# Patient Record
Sex: Male | Born: 1985 | Race: Black or African American | Hispanic: No | Marital: Single | Smoking: Former smoker
Health system: Southern US, Community
[De-identification: ages and names within clinical notes are randomized; demographics above are authoritative.]

---

## 2011-05-22 ENCOUNTER — Ambulatory Visit (INDEPENDENT_AMBULATORY_CARE_PROVIDER_SITE_OTHER): Payer: Managed Care, Other (non HMO) | Admitting: Physician Assistant

## 2011-05-22 DIAGNOSIS — Z209 Contact with and (suspected) exposure to unspecified communicable disease: Secondary | ICD-10-CM

## 2011-05-22 DIAGNOSIS — B86 Scabies: Secondary | ICD-10-CM

## 2011-05-22 DIAGNOSIS — N489 Disorder of penis, unspecified: Secondary | ICD-10-CM

## 2011-05-30 ENCOUNTER — Telehealth: Payer: Self-pay

## 2011-05-30 NOTE — Telephone Encounter (Signed)
Amy from [facility] calling to talk with a nurse regarding pts diagnosis of chlymidia  Phone is [phone]

## 2011-05-30 NOTE — Telephone Encounter (Signed)
Eugene Bailey stated that she got everything she needed

## 2011-05-30 NOTE — Telephone Encounter (Signed)
Spoke with Kathie Rhodes at [employer] because number for Amy in the message was given was not a valid number.  Kathie Rhodes will be calling Amy to give [place] a call back.

## 2011-06-13 ENCOUNTER — Ambulatory Visit (INDEPENDENT_AMBULATORY_CARE_PROVIDER_SITE_OTHER): Payer: Managed Care, Other (non HMO) | Admitting: Physician Assistant

## 2011-06-13 VITALS — BP 119/73 | HR 60 | Temp 97.7°F | Resp 16 | Ht 73.0 in | Wt 177.0 lb

## 2011-06-13 DIAGNOSIS — Z8619 Personal history of other infectious and parasitic diseases: Secondary | ICD-10-CM

## 2011-06-13 NOTE — Progress Notes (Signed)
  Subjective:    Patient ID: Eusebio Blazejewski, male    DOB: 1985-06-14, 26 y.o.   MRN: [id number]  HPI  Presents to clinic for recheck STD - tested Chlamydia positive on 1/23 and took Zithromax - had no symptoms then and has no symptoms now.  ! Current male partner has also been treated.  Review of Systems     Objective:   Physical Exam  Constitutional: He is oriented to person, place, and time. He appears well-developed and well-nourished.  HENT:  Head: Normocephalic and atraumatic.  Right Ear: External ear normal.  Left Ear: External ear normal.  Pulmonary/Chest: Effort normal.  Neurological: He is alert and oriented to person, place, and time.  Skin: Skin is warm and dry.  Psychiatric: He has a normal mood and affect. His behavior is normal. Judgment and thought content normal.          Assessment & Plan:   1. H/O chlamydia infection  GC/chlamydia probe amp, urine   Will call pt with results - if needs abx will Rx at that time.

## 2011-06-14 LAB — GC/CHLAMYDIA PROBE AMP, URINE
Chlamydia, Swab/Urine, PCR: NEGATIVE
GC Probe Amp, Urine: NEGATIVE

## 2013-11-26 ENCOUNTER — Emergency Department: Payer: Self-pay | Admitting: Emergency Medicine

## 2015-01-22 ENCOUNTER — Emergency Department (HOSPITAL_COMMUNITY): Payer: No Typology Code available for payment source

## 2015-01-22 ENCOUNTER — Emergency Department (HOSPITAL_COMMUNITY)
Admission: EM | Admit: 2015-01-22 | Discharge: 2015-01-22 | Disposition: A | Discharge status: Home / Self Care | Payer: No Typology Code available for payment source | Attending: Emergency Medicine | Admitting: Emergency Medicine

## 2015-01-22 ENCOUNTER — Encounter (HOSPITAL_COMMUNITY): Payer: Self-pay

## 2015-01-22 DIAGNOSIS — S3992XA Unspecified injury of lower back, initial encounter: Secondary | ICD-10-CM | POA: Diagnosis not present

## 2015-01-22 DIAGNOSIS — F10920 Alcohol use, unspecified with intoxication, uncomplicated: Secondary | ICD-10-CM

## 2015-01-22 DIAGNOSIS — Y92410 Unspecified street and highway as the place of occurrence of the external cause: Secondary | ICD-10-CM | POA: Insufficient documentation

## 2015-01-22 DIAGNOSIS — F10120 Alcohol abuse with intoxication, uncomplicated: Secondary | ICD-10-CM | POA: Insufficient documentation

## 2015-01-22 DIAGNOSIS — M25512 Pain in left shoulder: Secondary | ICD-10-CM

## 2015-01-22 DIAGNOSIS — S4992XA Unspecified injury of left shoulder and upper arm, initial encounter: Secondary | ICD-10-CM | POA: Diagnosis present

## 2015-01-22 DIAGNOSIS — N289 Disorder of kidney and ureter, unspecified: Secondary | ICD-10-CM | POA: Insufficient documentation

## 2015-01-22 DIAGNOSIS — Z87891 Personal history of nicotine dependence: Secondary | ICD-10-CM | POA: Diagnosis not present

## 2015-01-22 DIAGNOSIS — Y998 Other external cause status: Secondary | ICD-10-CM | POA: Insufficient documentation

## 2015-01-22 DIAGNOSIS — Y9389 Activity, other specified: Secondary | ICD-10-CM | POA: Diagnosis not present

## 2015-01-22 DIAGNOSIS — R000 Tachycardia, unspecified: Secondary | ICD-10-CM | POA: Insufficient documentation

## 2015-01-22 LAB — I-STAT CHEM 8, ED
BUN: 11 mg/dL (ref 6–20)
CALCIUM ION: 1.09 mmol/L — AB (ref 1.12–1.23)
CHLORIDE: 104 mmol/L (ref 101–111)
Creatinine, Ser: 1.7 mg/dL — ABNORMAL HIGH (ref 0.61–1.24)
Glucose, Bld: 109 mg/dL — ABNORMAL HIGH (ref 65–99)
HCT: 46 % (ref 39.0–52.0)
Hemoglobin: 15.6 g/dL (ref 13.0–17.0)
Potassium: 4.2 mmol/L (ref 3.5–5.1)
SODIUM: 143 mmol/L (ref 135–145)
TCO2: 23 mmol/L (ref 0–100)

## 2015-01-22 MED ORDER — IOHEXOL 300 MG/ML  SOLN
100.0000 mL | Freq: Once | INTRAMUSCULAR | Status: AC | PRN
  Administered 2015-01-22: 100 mL via INTRAVENOUS

## 2015-01-22 MED ORDER — METHOCARBAMOL 500 MG PO TABS: 500.0000 mg | ORAL_TABLET | Freq: Four times a day (QID) | ORAL | Status: DC | PRN

## 2015-01-22 MED ORDER — SODIUM CHLORIDE 0.9 % IV BOLUS (SEPSIS)
1000.0000 mL | Freq: Once | INTRAVENOUS | Status: AC
Start: 2015-01-22 — End: 2015-01-22
  Administered 2015-01-22: 1000 mL via INTRAVENOUS

## 2015-01-22 NOTE — ED Notes (Signed)
PA at bedside.
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
## 2019-12-29 ENCOUNTER — Ambulatory Visit: Payer: Self-pay

## 2019-12-29 NOTE — Telephone Encounter (Signed)
Patient called and says since Monday he's been having moderate to severe abdominal pain to the center of his chest over to the right. He says it started on the right side, then moved to the upper abdomen in the center of the chest. He says the pain is so bad at times it's hard to take a deep breath. He says he hasn't eaten anything in 4 days, he only drinks and sometimes that will come back up. He says eating makes it worse, he doesn't have an appetite. I advised him to go to the ED for evaluation, he verbalized understanding.  Reason for Disposition . [1] SEVERE pain (e.g., excruciating) AND [2] present > 1 hour  Answer Assessment - Initial Assessment Questions 1. LOCATION: "Where does it hurt?"      Upper mid abdomen, sharp to the middle of my stomach 2. RADIATION: "Does the pain shoot anywhere else?" (e.g., chest, back)     Moves to the right 3. ONSET: "When did the pain begin?" (e.g., minutes, hours or days ago)      Noticed it on Monday 4. SUDDEN: "Gradual or sudden onset?"     Sudden started on the right side 5. PATTERN "Does the pain come and go, or is it constant?"    - If constant: "Is it getting better, staying the same, or worsening?"      (Note: Constant means the pain never goes away completely; most serious pain is constant and it progresses)     - If intermittent: "How long does it last?" "Do you have pain now?"     (Note: Intermittent means the pain goes away completely between bouts)     Constant 6. SEVERITY: "How bad is the pain?"  (e.g., Scale 1-10; mild, moderate, or severe)    - MILD (1-3): doesn't interfere with normal activities, abdomen soft and not tender to touch     - MODERATE (4-7): interferes with normal activities or awakens from sleep, tender to touch     - SEVERE (8-10): excruciating pain, doubled over, unable to do any normal activities       Moderate 5-7 7. RECURRENT SYMPTOM: "Have you ever had this type of stomach pain before?" If Yes, ask: "When was the  last time?" and "What happened that time?"      No 8. AGGRAVATING FACTORS: "Does anything seem to cause this pain?" (e.g., foods, stress, alcohol)     Eating 9. CARDIAC SYMPTOMS: "Do you have any of the following symptoms: chest pain, difficulty breathing, sweating, nausea?"     Nausea, hard to breathe because of the pain 10. OTHER SYMPTOMS: "Do you have any other symptoms?" (e.g., fever, vomiting, diarrhea)       Vomiting, loss of appetite 11. PREGNANCY: "Is there any chance you are pregnant?" "When was your last menstrual period?"       N/A  Protocols used: ABDOMINAL PAIN - UPPER-A-AH

## 2023-10-20 ENCOUNTER — Ambulatory Visit (HOSPITAL_COMMUNITY)
Admission: EM | Admit: 2023-10-20 | Discharge: 2023-10-20 | Disposition: A | Discharge status: Home / Self Care | Attending: Nurse Practitioner | Admitting: Nurse Practitioner

## 2023-10-20 ENCOUNTER — Encounter (HOSPITAL_COMMUNITY): Payer: Self-pay

## 2023-10-20 ENCOUNTER — Ambulatory Visit (INDEPENDENT_AMBULATORY_CARE_PROVIDER_SITE_OTHER)

## 2023-10-20 DIAGNOSIS — M25561 Pain in right knee: Secondary | ICD-10-CM

## 2023-10-20 MED ORDER — NAPROXEN 500 MG PO TABS: 500.0000 mg | ORAL_TABLET | Freq: Two times a day (BID) | ORAL | 0 refills | Status: DC

## 2023-10-20 NOTE — ED Triage Notes (Signed)
 Patient here today with c/o right knee pain after squatting today. Patient started in the thigh and moved to knee.

## 2023-10-20 NOTE — ED Provider Notes (Signed)
 [facility]    CSN: [id number] Arrival date & time: 10/20/23  1432      History   Chief Complaint Chief Complaint  Patient presents with   Knee Pain    HPI Eugene Bailey is a 38 y.o. male.   Discussed the use of AI scribe software for clinical note transcription with the patient, who gave verbal consent to proceed.   Eugene Bailey Reason is a 38 y.o. male that presents with acute knee pain that began this morning. The patient reports waking up feeling fine, but upon squatting outside, he experienced a sharp pain in his thigh that then moved to his knee.  The pain is described as constant around the sides of the knee and shoots back up to the thigh when the leg is extended. He reports tingling and numbness up into the thigh as well that occurs intermittently. The patient denies any swelling. Patient mentions having had knee issues before, but states this episode is more persistent than previous occurrences. He has attempted to manage the pain with ice and a lidocaine patch, which has not provided any significant relief. The patient denies taking any oral medications for the pain.  The following portions of the patient's history were reviewed and updated as appropriate: allergies, current medications, past family history, past medical history, past social history, past surgical history, and problem list.    History reviewed. No pertinent past medical history.  There are no active problems to display for this patient.   History reviewed. No pertinent surgical history.     Home Medications    Prior to Admission medications   Medication Sig Start Date End Date Taking? Authorizing Provider  naproxen (NAPROSYN) 500 MG tablet Take 1 tablet (500 mg total) by mouth 2 (two) times daily with a meal. 10/20/23  Yes Iola Lukes, FNP    Family History History reviewed. No pertinent family history.  Social History Social History   Tobacco Use   Smoking status:  Former  Substance Use Topics   Alcohol use: Yes     Allergies   Tomato   Review of Systems Review of Systems  Musculoskeletal:  Positive for gait problem.       Right knee pain  Neurological:  Negative for weakness and numbness.  All other systems reviewed and are negative.    Physical Exam Triage Vital Signs ED Triage Vitals  Encounter Vitals Group     BP 10/20/23 1531 126/78     Girls Systolic BP Percentile --      Girls Diastolic BP Percentile --      Boys Systolic BP Percentile --      Boys Diastolic BP Percentile --      Pulse Rate 10/20/23 1531 70     Resp 10/20/23 1531 16     Temp 10/20/23 1531 98.6 F (37 C)     Temp Source 10/20/23 1531 Oral     SpO2 10/20/23 1531 95 %     Weight --      Height --      Head Circumference --      Peak Flow --      Pain Score 10/20/23 1532 8     Pain Loc --      Pain Education --      Exclude from Growth Chart --    No data found.  Updated Vital Signs BP 126/78 (BP Location: Right Arm)   Pulse 70   Temp 98.6 F (37 C) (Oral)  Resp 16   SpO2 95%   Visual Acuity Right Eye Distance:   Left Eye Distance:   Bilateral Distance:    Right Eye Near:   Left Eye Near:    Bilateral Near:     Physical Exam Vitals reviewed.  Constitutional:      General: He is not in acute distress.    Appearance: Normal appearance. He is not toxic-appearing.  HENT:     Head: Normocephalic.     Mouth/Throat:     Mouth: Mucous membranes are moist.   Eyes:     Conjunctiva/sclera: Conjunctivae normal.    Cardiovascular:     Rate and Rhythm: Normal rate and regular rhythm.     Heart sounds: Normal heart sounds.  Pulmonary:     Effort: Pulmonary effort is normal.     Breath sounds: Normal breath sounds.   Musculoskeletal:        General: Normal range of motion.     Right knee: No swelling, deformity, effusion, erythema, ecchymosis or crepitus. Normal range of motion. Tenderness present over the medial joint line and lateral  joint line. Normal alignment, normal meniscus and normal patellar mobility.       Legs:   Skin:    General: Skin is warm and dry.   Neurological:     General: No focal deficit present.     Mental Status: He is alert and oriented to person, place, and time.     Sensory: Sensation is intact.     Motor: Motor function is intact.     Coordination: Coordination is intact.     Gait: Gait is intact.      UC Treatments / Results  Labs (all labs ordered are listed, but only abnormal results are displayed) Labs Reviewed - No data to display  EKG   Radiology DG Knee Complete 4 Views Right Result Date: 10/20/2023 CLINICAL DATA:  Acute pain without injury EXAM: RIGHT KNEE - COMPLETE 4 VIEW COMPARISON:  None Available. FINDINGS: No evidence of fracture, dislocation, or joint effusion. No evidence of arthropathy or other focal bone abnormality. Soft tissues are unremarkable. IMPRESSION: No acute osseous abnormality. Electronically Signed   By: Ranell Bring M.D.   On: 10/20/2023 16:19    Procedures Procedures (including critical care time)  Medications Ordered in UC Medications - No data to display  Initial Impression / Assessment and Plan / UC Course  I have reviewed the triage vital signs and the nursing notes.  Pertinent labs & imaging results that were available during my care of the patient were reviewed by me and considered in my medical decision making (see chart for details).    The patient presents with acute onset knee pain that began this morning, initially starting in the thigh and radiating to the knee following a squatting motion. Pain is described as constant along the sides of the knee and radiates back up the thigh with leg extension. There is no visible swelling, but the patient reports intermittent numbness and tingling in the anterior thigh. Lidocaine patches and ice have provided minimal relief. Given the sudden onset, lack of swelling, and distribution of symptoms,  the likely diagnosis is tendinitis or inflammatory soft tissue injury. A knee X-ray was ordered to rule out bony abnormalities or joint effusion; results will be reviewed by radiology and the patient will be contacted only if findings are concerning. The patient was prescribed Naprosyn to be taken twice daily with food for 10 days and advised to avoid other  NSAIDs while on this medication. Supportive treatment includes continued use of lidocaine patches, intermittent ice application for 20 minutes at a time, and use of a knee sleeve for compression. The patient was educated on the PRICE method (Protect, Rest, Ice, Compression, Elevation) and may discontinue the sleeve as symptoms improve. Tylenol may be used for additional pain relief if needed. Follow-up with orthopedics is recommended if symptoms do not improve within 10 days. ED evaluation is advised for worsening pain, swelling, or new neurological symptoms.  Today's evaluation has revealed no signs of a dangerous process. Discussed diagnosis with patient and/or guardian. Patient and/or guardian aware of their diagnosis, possible red flag symptoms to watch out for and need for close follow up. Patient and/or guardian understands verbal and written discharge instructions. Patient and/or guardian comfortable with plan and disposition.  Patient and/or guardian has a clear mental status at this time, good insight into illness (after discussion and teaching) and has clear judgment to make decisions regarding their care  Documentation was completed with the aid of voice recognition software. Transcription may contain typographical errors. Final Clinical Impressions(s) / UC Diagnoses   Final diagnoses:  Acute pain of right knee     Discharge Instructions      You were seen today for sudden knee pain that began after squatting. The pain started in your thigh and moved to your knee, with discomfort radiating back up the thigh during leg extension. There is  no swelling, but you've noticed some numbness and tingling in the front of your thigh. These symptoms are likely due to inflammation or tendon strain. A knee X-ray was done today, and results will be reviewed by a radiologist. You will be contacted if anything concerning is found, or you can check your MyChart for updates. You have been prescribed Naprosyn to take twice daily with food for 10 days to reduce pain and inflammation. Avoid aspirin or other NSAIDs while taking this medication. If needed, you may take Tylenol (acetaminophen) 1000 mg every six hours for additional pain relief. This equals two 500 mg tablets at a time. Be careful not to take more than 4000 mg of Tylenol in a 24-hour period. You may continue to use lidocaine patches. Apply ice for about 20 minutes at a time, several times throughout the day. Always place a towel or cloth between the ice and your skin to protect it. If your skin becomes very red or you lose the sensation of cold, remove the ice immediately to avoid injury. A knee sleeve has been provided to support your knee and help control discomfort. If your toes start to tingle, feel numb, or turn cold and blue, loosen the sleeve or brace right away. You can remove it when sleeping or showering. You may stop using the knee sleeve as your symptoms improve. Whenever possible, keep your leg elevated by propping it up on a pillow, especially under the knee, to reduce any swelling.  If your symptoms do not improve within the next 10 to 14 days, please follow up with an orthopedic specialist for further evaluation.  Seek emergency care if you develop significant swelling, worsening pain, sudden weakness in the leg, or loss of sensation.         ED Prescriptions     Medication Sig Dispense Auth. Provider   naproxen (NAPROSYN) 500 MG tablet Take 1 tablet (500 mg total) by mouth 2 (two) times daily with a meal. 20 tablet Iola Lukes, FNP      PDMP  not reviewed this  encounter.   Iola Lukes, [place] 10/20/23 [phone]

## 2023-10-20 NOTE — Discharge Instructions (Addendum)
 You were seen today for sudden knee pain that began after squatting. The pain started in your thigh and moved to your knee, with discomfort radiating back up the thigh during leg extension. There is no swelling, but you've noticed some numbness and tingling in the front of your thigh. These symptoms are likely due to inflammation or tendon strain. A knee X-ray was done today, and results will be reviewed by a radiologist. You will be contacted if anything concerning is found, or you can check your MyChart for updates. You have been prescribed Naprosyn to take twice daily with food for 10 days to reduce pain and inflammation. Avoid aspirin or other NSAIDs while taking this medication. If needed, you may take Tylenol (acetaminophen) 1000 mg every six hours for additional pain relief. This equals two 500 mg tablets at a time. Be careful not to take more than 4000 mg of Tylenol in a 24-hour period. You may continue to use lidocaine patches. Apply ice for about 20 minutes at a time, several times throughout the day. Always place a towel or cloth between the ice and your skin to protect it. If your skin becomes very red or you lose the sensation of cold, remove the ice immediately to avoid injury. A knee sleeve has been provided to support your knee and help control discomfort. If your toes start to tingle, feel numb, or turn cold and blue, loosen the sleeve or brace right away. You can remove it when sleeping or showering. You may stop using the knee sleeve as your symptoms improve. Whenever possible, keep your leg elevated by propping it up on a pillow, especially under the knee, to reduce any swelling.  If your symptoms do not improve within the next 10 to 14 days, please follow up with an orthopedic specialist for further evaluation.  Seek emergency care if you develop significant swelling, worsening pain, sudden weakness in the leg, or loss of sensation.

## 2023-12-09 ENCOUNTER — Telehealth: Admitting: Family Medicine

## 2023-12-09 ENCOUNTER — Other Ambulatory Visit (HOSPITAL_COMMUNITY): Payer: Self-pay

## 2023-12-09 DIAGNOSIS — H60312 Diffuse otitis externa, left ear: Secondary | ICD-10-CM | POA: Diagnosis not present

## 2023-12-09 MED ORDER — NEOMYCIN-POLYMYXIN-HC 3.5-10000-1 OT SOLN
3.0000 [drp] | Freq: Four times a day (QID) | OTIC | 0 refills | Status: AC
  Filled 2023-12-09 (×2): qty 10, 17d supply, fill #0

## 2023-12-09 NOTE — Progress Notes (Signed)
 Virtual Visit Consent   Eugene Bailey, you are scheduled for a virtual visit with a [facility] provider today. Just as with appointments in the office, your consent must be obtained to participate. Your consent will be active for this visit and any virtual visit you may have with one of our providers in the next 365 days. If you have a MyChart account, a copy of this consent can be sent to you electronically.  As this is a virtual visit, video technology does not allow for your provider to perform a traditional examination. This may limit your provider's ability to fully assess your condition. If your provider identifies any concerns that need to be evaluated in person or the need to arrange testing (such as labs, EKG, etc.), we will make arrangements to do so. Although advances in technology are sophisticated, we cannot ensure that it will always work on either your end or our end. If the connection with a video visit is poor, the visit may have to be switched to a telephone visit. With either a video or telephone visit, we are not always able to ensure that we have a secure connection.  By engaging in this virtual visit, you consent to the provision of healthcare and authorize for your insurance to be billed (if applicable) for the services provided during this visit. Depending on your insurance coverage, you may receive a charge related to this service.  I need to obtain your verbal consent now. Are you willing to proceed with your visit today? Michaeljoseph Revolorio has provided verbal consent on 12/09/2023 for a virtual visit (video or telephone). Loa Lamp, FNP  Date: 12/09/2023 5:21 PM   Virtual Visit via Video Note   I, Loa Lamp, connected with  Nareg Breighner  ([id number], 08-31-1985) on 12/09/23 at  5:15 PM EDT by a video-enabled telemedicine application and verified that I am speaking with the correct person using two identifiers.  Location: Patient: Virtual Visit Location Patient:  Home Provider: Virtual Visit Location Provider: Home Office   I discussed the limitations of evaluation and management by telemedicine and the availability of in person appointments. The patient expressed understanding and agreed to proceed.    History of Present Illness: Eugene Bailey is a 38 y.o. who identifies as a male who was assigned male at birth, and is being seen today for pain in left ear with pressing tragus and pulling ear lobe/ No drainage, No fever. SABRA  HPI: HPI  Problems: There are no active problems to display for this patient.   Allergies:  Allergies  Allergen Reactions   Tomato Hives   Medications:  Current Outpatient Medications:    neomycin-polymyxin-hydrocortisone (CORTISPORIN) OTIC solution, Place 3 drops into the left ear 4 (four) times daily for 7 days., Disp: 10 mL, Rfl: 0   naproxen (NAPROSYN) 500 MG tablet, Take 1 tablet (500 mg total) by mouth 2 (two) times daily with a meal., Disp: 20 tablet, Rfl: 0  Observations/Objective: Patient is well-developed, well-nourished in no acute distress.  Resting comfortably  at home.  Head is normocephalic, atraumatic.  No labored breathing.  Speech is clear and coherent with logical content.  Patient is alert and oriented at baseline.    Assessment and Plan: 1. Acute diffuse otitis externa of left ear (Primary)  Heat, ibuprofen, no water in ears, recheck as needed in UC.   Follow Up Instructions: I discussed the assessment and treatment plan with the patient. The patient was provided an opportunity to ask questions and  all were answered. The patient agreed with the plan and demonstrated an understanding of the instructions.  A copy of instructions were sent to the patient via MyChart unless otherwise noted below.     The patient was advised to call back or seek an in-person evaluation if the symptoms worsen or if the condition fails to improve as anticipated.    Raziel Koenigs, FNP

## 2023-12-09 NOTE — Patient Instructions (Signed)
 Otitis Externa  Otitis externa is an infection of the outer ear canal. The outer ear canal is the area between the outside of the ear and the eardrum. Otitis externa is sometimes called swimmer's ear. What are the causes? Common causes of this condition include: Swimming in dirty water. Moisture in the ear. An injury to the inside of the ear. An object stuck in the ear. A cut or scrape on the outside of the ear or in the ear canal. What increases the risk? You are more likely to develop this condition if you go swimming often. What are the signs or symptoms? The first symptom of this condition is often itching in the ear. Later symptoms of the condition include: Swelling of the ear. Redness in the ear. Ear pain. The pain may get worse when you pull on your ear. Pus coming from the ear. How is this diagnosed? This condition may be diagnosed by examining the ear and testing fluid from the ear for bacteria and funguses. How is this treated? This condition may be treated with: Antibiotic ear drops. These are often given for 10-14 days. Medicines to reduce itching and swelling. Follow these instructions at home: If you were prescribed antibiotic ear drops, use them as told by your health care provider. Do not stop using the antibiotic even if you start to feel better. Take over-the-counter and prescription medicines only as told by your health care provider. Avoid getting water in your ears as told by your health care provider. This may include avoiding swimming or water sports for a few days. Keep all follow-up visits. This is important. How is this prevented? Keep your ears dry. Use the corner of a towel to dry your ears after you swim or bathe. Avoid scratching or putting things in your ear. Doing these things can damage the ear canal or remove the protective wax that lines it, which makes it easier for bacteria and funguses to grow. Avoid swimming in lakes, polluted water, or swimming  pools that may not have enough chlorine. Contact a health care provider if: You have a fever. Your ear is still red, swollen, painful, or draining pus after 3 days. Your redness, swelling, or pain gets worse. You have a severe headache. Get help right away if: You have redness, swelling, and pain or tenderness in the area behind your ear. Summary Otitis externa is an infection of the outer ear canal. Common causes include swimming in dirty water, moisture in the ear, or a cut or scrape in the ear. Symptoms include pain, redness, and swelling of the ear canal. If you were prescribed antibiotic ear drops, use them as told by your health care provider. Do not stop using the antibiotic even if you start to feel better. This information is not intended to replace advice given to you by your health care provider. Make sure you discuss any questions you have with your health care provider. Document Revised: 06/28/2020 Document Reviewed: 06/28/2020 Elsevier Patient Education  2024 [employer].

## 2023-12-10 ENCOUNTER — Other Ambulatory Visit (HOSPITAL_COMMUNITY): Payer: Self-pay

## 2023-12-19 ENCOUNTER — Other Ambulatory Visit (HOSPITAL_COMMUNITY): Payer: Self-pay

## 2024-02-02 ENCOUNTER — Ambulatory Visit: Payer: Self-pay | Admitting: Nurse Practitioner

## 2024-02-02 ENCOUNTER — Ambulatory Visit (HOSPITAL_BASED_OUTPATIENT_CLINIC_OR_DEPARTMENT_OTHER): Admit: 2024-02-02 | Discharge: 2024-02-02 | Disposition: A | Attending: Physician Assistant

## 2024-02-02 ENCOUNTER — Telehealth

## 2024-02-02 ENCOUNTER — Emergency Department (HOSPITAL_BASED_OUTPATIENT_CLINIC_OR_DEPARTMENT_OTHER)
Admit: 2024-02-02 | Discharge: 2024-02-02 | Disposition: A | Discharge status: Home / Self Care | Attending: Physician Assistant | Admitting: Physician Assistant

## 2024-02-02 ENCOUNTER — Ambulatory Visit
Admission: RE | Admit: 2024-02-02 | Discharge: 2024-02-02 | Disposition: A | Discharge status: Home / Self Care | Source: Ambulatory Visit | Attending: Nurse Practitioner

## 2024-02-02 ENCOUNTER — Ambulatory Visit (HOSPITAL_BASED_OUTPATIENT_CLINIC_OR_DEPARTMENT_OTHER)
Admission: RE | Admit: 2024-02-02 | Discharge: 2024-02-02 | Disposition: A | Discharge status: Home / Self Care | Source: Ambulatory Visit | Attending: Nurse Practitioner | Admitting: Nurse Practitioner

## 2024-02-02 VITALS — BP 132/78 | HR 82 | Temp 97.9°F | Resp 17

## 2024-02-02 DIAGNOSIS — R591 Generalized enlarged lymph nodes: Secondary | ICD-10-CM

## 2024-02-02 DIAGNOSIS — N433 Hydrocele, unspecified: Secondary | ICD-10-CM | POA: Diagnosis not present

## 2024-02-02 DIAGNOSIS — R5383 Other fatigue: Secondary | ICD-10-CM

## 2024-02-02 DIAGNOSIS — R6883 Chills (without fever): Secondary | ICD-10-CM | POA: Diagnosis not present

## 2024-02-02 DIAGNOSIS — R140 Abdominal distension (gaseous): Secondary | ICD-10-CM

## 2024-02-02 DIAGNOSIS — N50812 Left testicular pain: Secondary | ICD-10-CM

## 2024-02-02 DIAGNOSIS — I861 Scrotal varices: Secondary | ICD-10-CM | POA: Diagnosis not present

## 2024-02-02 DIAGNOSIS — R634 Abnormal weight loss: Secondary | ICD-10-CM | POA: Diagnosis present

## 2024-02-02 DIAGNOSIS — R1033 Periumbilical pain: Secondary | ICD-10-CM | POA: Insufficient documentation

## 2024-02-02 DIAGNOSIS — R162 Hepatomegaly with splenomegaly, not elsewhere classified: Secondary | ICD-10-CM | POA: Diagnosis not present

## 2024-02-02 MED ORDER — OXYCODONE HCL 5 MG PO TABS: 5.0000 mg | ORAL_TABLET | ORAL | 0 refills | Status: AC | PRN

## 2024-02-02 NOTE — ED Provider Notes (Addendum)
 [facility]    CSN: [id number] Arrival date & time: 02/02/24  1056      History   Chief Complaint Chief Complaint  Patient presents with   Abdominal Pain    Left testicular pain/below navel painLymph node like bumps (bean size) showing in abdomen and one in left armpitCold/tired/achy feelings. Can be in warm in environments and still get chills/feel cold. - Entered by patient    HPI Eugene Bailey is a 38 y.o. male.   Discussed the use of AI scribe software for clinical note transcription with the patient, who gave verbal consent to proceed.   The patient with no known medical or surgical history and no current medications, presenting with multiple complaints including firm nodules in various body locations, abdominal discomfort, recent testicular pain, constitutional symptoms, and unintentional weight loss.  He first noted firm knots about three weeks ago, located in the left upper arm, right abdomen, left lumbar region, left arm, axilla, beneath the left breast, and right thigh. Most are painless, though the left back lesion is tender and the left arm nodule is painful when touched or exposed to cold.  For the past 2-3 months, the patient has also experienced chills, generalized body aches, fatigue, and feeling cold consistently. He reports mild upper respiratory symptoms with a runny nose and occasional dry cough but denies chest pain, palpitations, shortness of breath, or lower extremity swelling. He additionally describes abdominal bloating and discomfort for the past two weeks, associated with a protruding navel and a sensation of downward pulling with ambulation. He denies nausea, vomiting, or changes in bowel movements.  Over the last 4-5 months, the patient has noticed appetite changes with early satiety, requiring him to eat small portions at a time. Despite attempts to gain weight, he reports unintentional weight loss.  Two days ago, he developed left  testicular pain described as tightness and discomfort, without swelling. The pain interferes with sleep, worsening when lying on the left side or stomach.  He also reports syncopal and near-syncopal episodes. On August 30, he fainted while standing in line at [employer], which he attributed to dehydration, stress, and not eating. Patient reports having a knot that developed after the fall from the syncopal episode that developed behind the left ear that is without any pain but hasn't resolved. More recently, this past Saturday (01/31/24), he experienced dizziness without loss of consciousness, followed by weakness and fatigue that improved after rest.  He denies rash, neck pain, stiffness, dysuria, hematuria, genital sores, penile discharge, penile pain, or genital swelling.  The following sections of the patient's history were reviewed and updated as appropriate: allergies, current medications, past family history, past medical history, past social history, past surgical history, and problem list.       History reviewed. No pertinent past medical history.  There are no active problems to display for this patient.   History reviewed. No pertinent surgical history.     Home Medications    Prior to Admission medications   Not on File    Family History History reviewed. No pertinent family history.  Social History Social History   Tobacco Use   Smoking status: Former  Substance Use Topics   Alcohol use: Yes     Allergies   Tomato   Review of Systems Review of Systems  Constitutional:  Positive for chills, fatigue and unexpected weight change. Negative for appetite change and fever.  HENT:  Positive for rhinorrhea. Negative for congestion, sneezing and sore throat.  Respiratory:  Positive for cough (dry). Negative for shortness of breath.   Cardiovascular:  Negative for chest pain, palpitations and leg swelling.  Gastrointestinal:  Positive for abdominal pain.  Negative for blood in stool, constipation, diarrhea, nausea and vomiting.  Genitourinary:  Positive for testicular pain (left). Negative for dysuria, genital sores, hematuria, penile discharge, penile pain, penile swelling and scrotal swelling.  Musculoskeletal:  Positive for myalgias.  Skin:  Positive for wound.  Neurological:  Positive for dizziness (01/31/24), syncope (12/27/23), weakness (generalized) and headaches.  All other systems reviewed and are negative.    Physical Exam Triage Vital Signs ED Triage Vitals  Encounter Vitals Group     BP 02/02/24 1148 132/78     Girls Systolic BP Percentile --      Girls Diastolic BP Percentile --      Boys Systolic BP Percentile --      Boys Diastolic BP Percentile --      Pulse Rate 02/02/24 1148 82     Resp 02/02/24 1148 17     Temp 02/02/24 1148 97.9 F (36.6 C)     Temp Source 02/02/24 1148 Oral     SpO2 02/02/24 1148 97 %     Weight --      Height --      Head Circumference --      Peak Flow --      Pain Score 02/02/24 1155 5     Pain Loc --      Pain Education --      Exclude from Growth Chart --    No data found.  Updated Vital Signs BP 132/78 (BP Location: Right Arm)   Pulse 82   Temp 97.9 F (36.6 C) (Oral)   Resp 17   SpO2 97%   Visual Acuity Right Eye Distance:   Left Eye Distance:   Bilateral Distance:    Right Eye Near:   Left Eye Near:    Bilateral Near:     Physical Exam Vitals reviewed. Exam conducted with a chaperone present Sidra, RN).  Constitutional:      General: He is awake. He is not in acute distress.    Appearance: Normal appearance. He is well-developed. He is not ill-appearing, toxic-appearing or diaphoretic.  HENT:     Head: Normocephalic.     Right Ear: Hearing and external ear normal.     Left Ear: Hearing and external ear normal.     Nose: Nose normal.     Mouth/Throat:     Lips: Pink.     Mouth: Mucous membranes are moist.     Pharynx: Oropharynx is clear. Uvula midline.   Eyes:     General: Lids are normal. Vision grossly intact.     Extraocular Movements: Extraocular movements intact.     Conjunctiva/sclera: Conjunctivae normal.     Pupils: Pupils are equal, round, and reactive to light.     Visual Fields: Right eye visual fields normal and left eye visual fields normal.  Cardiovascular:     Rate and Rhythm: Normal rate and regular rhythm.     Heart sounds: Normal heart sounds.  Pulmonary:     Effort: Pulmonary effort is normal.     Breath sounds: Normal breath sounds and air entry.  Abdominal:     General: Bowel sounds are decreased. There is distension.     Palpations: Abdomen is soft.     Tenderness: There is abdominal tenderness in the periumbilical area. There is no guarding or  rebound.  Genitourinary:    Penis: Normal and circumcised. No erythema, tenderness, discharge, swelling or lesions.      Testes:        Right: Cremasteric reflex is present.         Left: Tenderness present. Swelling not present. Cremasteric reflex is absent.      Comments: The left testicle with an irregular, lumpy texture and is tender to palpation. The right testicle is smooth, without masses or tenderness. No scrotal erythema, edema, or lesions are observed. Cremasteric reflex absent on the left.  Musculoskeletal:        General: Normal range of motion.     Cervical back: Full passive range of motion without pain, normal range of motion and neck supple.  Lymphadenopathy:     Head:     Left side of head: Posterior auricular and occipital adenopathy present. No submental, submandibular, tonsillar or preauricular adenopathy.     Cervical: Cervical adenopathy present.     Right cervical: Superficial cervical adenopathy and posterior cervical adenopathy present.     Left cervical: Superficial cervical adenopathy and posterior cervical adenopathy present.     Upper Body:     Left upper body: Supraclavicular adenopathy, axillary adenopathy, pectoral adenopathy and  epitrochlear adenopathy present.     Lower Body: Right inguinal adenopathy present. Left inguinal adenopathy present.     Comments: Generalized lymphadenopathy involving multiple regions, including postauricular, cervical, supraclavicular/pectoral, axillary, hilar, mesenteric, brachial, iliac, inguinal, and femoral sites. Several nodes were noted by the patient, while others were identified on exam. The lymphadenopathy is firm, rounded, and varies in size from approximately 1 x 1 cm to 2 x 2 cm in diameter, with the largest node located in the left postauricular region, measuring approximately 2 x 5 cm (some are pictured below while others were noted during GU exam and images not captured)   Skin:    General: Skin is warm and dry.  Neurological:     General: No focal deficit present.     Mental Status: He is alert and oriented to person, place, and time.  Psychiatric:        Speech: Speech normal.        Behavior: Behavior is cooperative.      #1 - left post-auricular, 2x5 cm   #2 - right abdomen (1x1 cm) and lower chest (0.5 x 0.5 cm)   #3 - left lumbar 2x2 cm   #4 - left chest area 1.5 x 2 cm   #5 - left axilla 1.5 x 1 cm   #6 - left upper arm 0.5x0.5 cm & 2x2 cm  UC Treatments / Results  Labs (all labs ordered are listed, but only abnormal results are displayed) Labs Reviewed  CBC WITH DIFFERENTIAL/PLATELET  SEDIMENTATION RATE  COMPREHENSIVE METABOLIC PANEL WITH GFR  HIV ANTIBODY (ROUTINE TESTING W REFLEX)  URIC ACID  RPR  LIPASE  TSH    EKG   Radiology No results found.  Procedures Procedures (including critical care time)  Medications Ordered in UC Medications - No data to display  Initial Impression / Assessment and Plan / UC Course  I have reviewed the triage vital signs and the nursing notes.  Pertinent labs & imaging results that were available during my care of the patient were reviewed by me and considered in my medical decision making (see  chart for details).     The patient presents with multiple areas of lymphadenopathy involving various body sites, left testicular pain with absent  cremasteric reflex, and systemic symptoms including fatigue, chills, and unintentional weight loss. On exam, there is no genital swelling, erythema, or evidence of acute infection. The constellation of findings raises concern for a systemic or malignant process, though infectious and inflammatory causes remain in the differential. The absent cremasteric reflex in the setting of testicular pain requires further urgent evaluation to exclude intermittent torsion or other testicular pathology. Given the extent of lymphadenopathy, systemic symptoms, and genitourinary findings, further diagnostic workup including imaging, laboratory studies is warranted.   Will obtain baseline laboratory studies including CBC with differential, CMP, HIV, lipase, RPR, sedimentation rate, TSH and uric acid to evaluate for hematologic, infectious, or inflammatory processes. Scrotal ultrasound with Doppler and abdominal ultrasound scheduled for today at 5 PM. He should remain NPO until these have been completed. A CXR has been ordered as well to evaluate for mediastinal and hilar lymphadenopathy as well as to assess for any pulmonary involvement. Results will help guide further workup depending on findings.  Referral to the appropriate specialists will be made based on the results or if lymphadenopathy persists or malignancy is suspected based on results. Patient advised to closely monitor for progression of symptoms and advised to seek immediate care for worsening testicular pain, acute swelling, systemic decline, or new neurologic symptoms.  Today's evaluation has revealed no signs of a dangerous process. Discussed diagnosis with patient and/or guardian. Patient and/or guardian aware of their diagnosis, possible red flag symptoms to watch out for and need for close follow up. Patient  and/or guardian understands verbal and written discharge instructions. Patient and/or guardian comfortable with plan and disposition.  Patient and/or guardian has a clear mental status at this time, good insight into illness (after discussion and teaching) and has clear judgment to make decisions regarding their care  Documentation was completed with the aid of voice recognition software. Transcription may contain typographical errors.  Final Clinical Impressions(s) / UC Diagnoses   Final diagnoses:  Lymphadenopathy, generalized  Fatigue, unspecified type  Unintentional weight loss  Left testicular pain  Abdominal distension     Discharge Instructions      You were evaluated today for swollen lymph nodes in different areas of your body, left-sided testicular pain, and symptoms such as fatigue, chills, and unintentional weight loss.  Because of your combination of symptoms, further testing is needed to look for possible causes including infections, inflammatory conditions, or a systemic or malignant process. Blood work was collected today to check for possible infections, inflammation, or blood disorders. You also have a scrotal ultrasound with Doppler and an abdominal ultrasound scheduled for today at 5 PM. Please do not eat or drink anything (remain NPO) until these studies are complete. You will be notified if any of your blood work or imaging is abnormal. You can also view all results on your MyChart. Based on the results of these tests, you may need referral to a specialist.  In the meantime, continue to rest and monitor your symptoms. Go to the emergency department immediately if your testicular pain worsens, if you develop sudden swelling or redness of the scrotum, if you feel extremely weak or short of breath, if you notice new neurological symptoms, or if your overall condition declines.      ED Prescriptions   None    PDMP not reviewed this encounter.   Iola Lukes,  FNP 02/02/24 1610    Iola Magalia, [place] 02/02/24 2052

## 2024-02-02 NOTE — Progress Notes (Signed)
 The patient was evaluated earlier today with blood work and stat imaging. Scrotal ultrasound, abdominal ultrasound, and chest X-ray were completed. Scrotal ultrasound revealed multiple hypoechoic, ill-defined areas within both testicles, highly concerning for a systemic process. Abdominal ultrasound demonstrated severe hepatosplenomegaly and increased renal echogenicity. The chest X-ray was normal without acute findings.  Taken together with the patient's history and physical exam, there is significant concern for a lymphoproliferative disorder such as lymphoma or leukemia. While infectious or inflammatory etiologies remain in the differential, they are considered less likely. Given the constellation of findings, an urgent oncology referral has been placed for further evaluation and management.  Imaging results and their implications were reviewed in detail with the patient via telephone. All questions were addressed, and the patient expressed understanding. Oxycodone was prescribed for acute pain with instructions to avoid acetaminophen, alcohol, and other hepatotoxic agents. He was advised to avoid high-impact activity, heavy lifting (nothing over 5 pounds), and strenuous exertion, while maintaining hydration, eating small frequent meals, and resting as needed. Light, non-strenuous activities are acceptable but he should not push through significant fatigue.  The patient was instructed to monitor closely for any new or worsening symptoms. Emergency precautions were reviewed. We will follow up on the oncology referral as well as pending blood work.

## 2024-02-02 NOTE — Telephone Encounter (Signed)
 Urgent referral submitted to [facility] at [facility]. Please contact their office to ensure the patient is scheduled for the earliest available appointment and confirm details with the patient.

## 2024-02-02 NOTE — Discharge Instructions (Addendum)
 You were evaluated today for swollen lymph nodes in different areas of your body, left-sided testicular pain, and symptoms such as fatigue, chills, and unintentional weight loss.  Because of your combination of symptoms, further testing is needed to look for possible causes including infections, inflammatory conditions, or a systemic or malignant process. Blood work was collected today to check for possible infections, inflammation, or blood disorders. You also have a scrotal ultrasound with Doppler and an abdominal ultrasound scheduled for today at 5 PM. Please do not eat or drink anything (remain NPO) until these studies are complete. You will be notified if any of your blood work or imaging is abnormal. You can also view all results on your MyChart. Based on the results of these tests, you may need referral to a specialist.  In the meantime, continue to rest and monitor your symptoms. Go to the emergency department immediately if your testicular pain worsens, if you develop sudden swelling or redness of the scrotum, if you feel extremely weak or short of breath, if you notice new neurological symptoms, or if your overall condition declines.

## 2024-02-02 NOTE — Patient Instructions (Signed)
  Lonni Dys, thank you for joining Delon CHRISTELLA Dickinson, PA-C for today's virtual visit.  While this provider is not your primary care provider (PCP), if your PCP is located in our provider database this encounter information will be shared with them immediately following your visit.   A [facility] MyChart account gives you access to today's visit and all your visits, tests, and labs performed at [facility]  click here if you don't have a [facility] MyChart account or go to [email or web address]  Consent: (Patient) Gorje Iyer provided verbal consent for this virtual visit at the beginning of the encounter.  Current Medications:  Current Outpatient Medications:    naproxen  (NAPROSYN ) 500 MG tablet, Take 1 tablet (500 mg total) by mouth 2 (two) times daily with a meal., Disp: 20 tablet, Rfl: 0   Medications ordered in this encounter:  No orders of the defined types were placed in this encounter.    *If you need refills on other medications prior to your next appointment, please contact your pharmacy*  Follow-Up: Call back or seek an in-person evaluation if the symptoms worsen or if the condition fails to improve as anticipated.  [facility] [phone]   If you have been instructed to have an in-person evaluation today at a local Urgent Care facility, please use the link below. It will take you to a list of all of our available [facility], including address, phone number and hours of operation. Please do not delay care.  [facility]  If you or a family member do not have a primary care provider, use the link below to schedule a visit and establish care. When you choose a [facility] primary care physician or advanced practice provider, you gain a long-term partner in health. Find a Primary Care Provider  Learn more about [facility] in-office and virtual care options: [facility] - Get Care Now

## 2024-02-02 NOTE — Progress Notes (Signed)
 Virtual Visit Consent   Eugene Bailey, you are scheduled for a virtual visit with a [facility] provider today. Just as with appointments in the office, your consent must be obtained to participate. Your consent will be active for this visit and any virtual visit you may have with one of our providers in the next 365 days. If you have a MyChart account, a copy of this consent can be sent to you electronically.  As this is a virtual visit, video technology does not allow for your provider to perform a traditional examination. This may limit your provider's ability to fully assess your condition. If your provider identifies any concerns that need to be evaluated in person or the need to arrange testing (such as labs, EKG, etc.), we will make arrangements to do so. Although advances in technology are sophisticated, we cannot ensure that it will always work on either your end or our end. If the connection with a video visit is poor, the visit may have to be switched to a telephone visit. With either a video or telephone visit, we are not always able to ensure that we have a secure connection.  By engaging in this virtual visit, you consent to the provision of healthcare and authorize for your insurance to be billed (if applicable) for the services provided during this visit. Depending on your insurance coverage, you may receive a charge related to this service.  I need to obtain your verbal consent now. Are you willing to proceed with your visit today? Eugene Bailey has provided verbal consent on 02/02/2024 for a virtual visit (video or telephone). Eugene CHRISTELLA Dickinson, Eugene Bailey  Date: 02/02/2024 8:09 AM   Virtual Visit via Video Note   I, Eugene Bailey, connected with  Eugene Bailey  ([id number], 11/11/1985) on 02/02/24 at  8:00 AM EDT by a video-enabled telemedicine application and verified that I am speaking with the correct person using two identifiers.  Location: Patient: Virtual Visit  Location Patient: Mobile Provider: Virtual Visit Location Provider: Home Office   I discussed the limitations of evaluation and management by telemedicine and the availability of in person appointments. The patient expressed understanding and agreed to proceed.    History of Present Illness: Eugene Bailey is a 38 y.o. who identifies as a male who was assigned male at birth, and is being seen today for generalized malaise.  Has been going on for 3 weeks to one month.   Feels cold often with chills. Gets shortness of breath with small activity like bowling. Does tire easily as well.   Also, notices nodules (right upper abdomen, left lower back, left axilla, left groin area).  Having left testicular pain. Has been worsening on the left. This has been progressing over the last two weeks. Feels tight.   Does report that he is umbilicus has always been an inny, but over the last 2 weeks it has been an Energy manager. Does feel a pulling sensation there.  HPI: HPI  Problems: There are no active problems to display for this patient.   Allergies:  Allergies  Allergen Reactions   Tomato Hives   Medications:  Current Outpatient Medications:    naproxen  (NAPROSYN ) 500 MG tablet, Take 1 tablet (500 mg total) by mouth 2 (two) times daily with a meal., Disp: 20 tablet, Rfl: 0  Observations/Objective: Patient is well-developed, well-nourished in no acute distress.  Resting comfortably  Head is normocephalic, atraumatic.  No labored breathing.  Speech is clear and coherent with logical content.  Patient is alert and oriented at baseline.    Assessment and Plan: 1. Left testicular pain (Primary)  2. Lymphadenopathy  3. Umbilical pain  - Multiple complaints that will benefit from in person evaluation. Advised to seek in person Urgent Care evaluation.  Follow Up Instructions: I discussed the assessment and treatment plan with the patient. The patient was provided an opportunity to ask  questions and all were answered. The patient agreed with the plan and demonstrated an understanding of the instructions.  A copy of instructions were sent to the patient via MyChart unless otherwise noted below.    The patient was advised to call back or seek an in-person evaluation if the symptoms worsen or if the condition fails to improve as anticipated.    Eugene CHRISTELLA Dickinson, Eugene Bailey

## 2024-02-02 NOTE — ED Triage Notes (Signed)
 Pt c/o left testicular pain and lower abdominal  pain. States his belly button is sticking out now when he normally has and has been feeling full for about 2-3weeks. Denies any issue with BM.  States he also has lumps on right abdomin, left armpit, left back that look like swollen lymph nodes for 2-3 weeks.   He has also has  felt cold, tired and achy since the first of the year.

## 2024-02-03 ENCOUNTER — Encounter: Payer: Self-pay | Admitting: Medical Oncology

## 2024-02-03 ENCOUNTER — Telehealth: Payer: Self-pay | Admitting: Physician Assistant

## 2024-02-03 LAB — COMPREHENSIVE METABOLIC PANEL WITH GFR
ALT: 23 IU/L (ref 0–44)
AST: 33 IU/L (ref 0–40)
Albumin: 4.6 g/dL (ref 4.1–5.1)
Alkaline Phosphatase: 79 IU/L (ref 47–123)
BUN/Creatinine Ratio: 12 (ref 9–20)
BUN: 16 mg/dL (ref 6–20)
Bilirubin Total: 0.5 mg/dL (ref 0.0–1.2)
CO2: 23 mmol/L (ref 20–29)
Calcium: 9.7 mg/dL (ref 8.7–10.2)
Chloride: 100 mmol/L (ref 96–106)
Creatinine, Ser: 1.33 mg/dL — ABNORMAL HIGH (ref 0.76–1.27)
Globulin, Total: 4.8 g/dL — ABNORMAL HIGH (ref 1.5–4.5)
Glucose: 78 mg/dL (ref 70–99)
Potassium: 3.3 mmol/L — ABNORMAL LOW (ref 3.5–5.2)
Sodium: 141 mmol/L (ref 134–144)
Total Protein: 9.4 g/dL — ABNORMAL HIGH (ref 6.0–8.5)
eGFR: 71 mL/min/1.73 (ref >59)

## 2024-02-03 LAB — CBC WITH DIFFERENTIAL/PLATELET
Basophils Absolute: 32.7 x10E3/uL — ABNORMAL HIGH (ref 0.0–0.2)
Basos: 8 %
EOS (ABSOLUTE): 24.6 x10E3/uL — ABNORMAL HIGH (ref 0.0–0.4)
Eos: 6 %
Hematocrit: 24.6 % — ABNORMAL LOW (ref 37.5–51.0)
Hemoglobin: 7.8 g/dL — ABNORMAL LOW (ref 13.0–17.7)
Lymphocytes Absolute: 4.1 x10E3/uL — ABNORMAL HIGH (ref 0.7–3.1)
Lymphs: 1 %
MCH: 29 pg (ref 26.6–33.0)
MCHC: 31.7 g/dL (ref 31.5–35.7)
MCV: 91 fL (ref 79–97)
Monocytes Absolute: 45 x10E3/uL — ABNORMAL HIGH (ref 0.1–0.9)
Monocytes: 11 %
NRBC: 3 % — ABNORMAL HIGH (ref 0–0)
Neutrophils Absolute: 126.9 x10E3/uL — ABNORMAL HIGH (ref 1.4–7.0)
Neutrophils: 31 %
Platelets: 164 x10E3/uL (ref 150–450)
RBC: 2.69 x10E6/uL — CL (ref 4.14–5.80)
RDW: 17.4 % — ABNORMAL HIGH (ref 11.6–15.4)
WBC: 409.3 x10E3/uL (ref 3.4–10.8)

## 2024-02-03 LAB — LIPASE: Lipase: 35 U/L (ref 13–78)

## 2024-02-03 LAB — URIC ACID: Uric Acid: 7.6 mg/dL (ref 3.8–8.4)

## 2024-02-03 LAB — HIV ANTIBODY (ROUTINE TESTING W REFLEX): HIV Screen 4th Generation wRfx: NONREACTIVE

## 2024-02-03 LAB — IMMATURE CELLS
Blasts/blast like cells: 8 % — ABNORMAL HIGH (ref 0–0)
MYELOCYTES: 14 % — ABNORMAL HIGH (ref 0–0)
Metamyelocytes: 19 % — ABNORMAL HIGH (ref 0–0)
PROMYELOCYTES: 2 % — ABNORMAL HIGH (ref 0–0)

## 2024-02-03 LAB — RPR: RPR Ser Ql: NONREACTIVE

## 2024-02-03 LAB — TSH: TSH: 1.58 u[IU]/mL (ref 0.450–4.500)

## 2024-02-03 LAB — SEDIMENTATION RATE: Sed Rate: 9 mm/h (ref 0–15)

## 2024-02-03 NOTE — Care Plan (Addendum)
 Internal Medicine Plan of Care HO-2 H&P Append Note  The patients case and plan of care was discussed with Dr. Pierrette. Please see Dr. Blanchard note for the full H&P.   Subjective: Briefly, Eugene Bailey is a 38 y.o. male with no significant past medical history who presents as a direct admission from the hematology/oncology clinic where he was seen earlier today for evaluation for new lymphoproliferative disease with recommended inpatient admission for further evaluation and management.  Patient initially did a visit with his PCP on 02/02/2024 for malaise, shortness of breath, and testicular pain for which she was evaluated in urgent care and underwent chest x-ray, abdominal ultrasound, and testicular ultrasound with findings overall concerning for new lymphoproliferative disease.  Upon arrival to the unit, he reports that he has had multifocal lymphadenopathy, fatigue, night sweats, and a 20 pound unintentional weight loss over the past 3 to 4 months.  He notes left testicular pain as well as pain around his navel with increased swelling.  He endorses left lower quadrant abdominal pain and back pain as well as decreased appetite/fatigue.  He notes fever and chills at home, occurring every 2 to 3 days.  He denies nausea, vomiting, dysuria, nasal congestion, or sick contacts.  He endorses sore throat with runny nose and started 1 week ago.  He endorses occasional marijuana use but denies alcohol, tobacco or any additional illicit drug use.   Vital Signs: Temperature 101.6, HR 89, RR 20, BP 155/87, SpO2 100% on room air  Labs significant for: Magnesium 1.8, uric acid 7.1, LDH 918, serum creatinine 1.33, WBC 374, hemoglobin 7.2, platelets 173, 11% blasts  Imaging/Other Studies:  - Chest x-ray: No acute findings or evidence of lymphadenopathy in the thorax - Abdominal ultrasound: Severe hepatosplenomegaly with increased echogenicity in the kidneys which can be seen in parenchymal medical disease. -  Scrotum ultrasound with Doppler: Multiple hypoechoic ill-defined areas throughout both testes highly concerning for systemic disease including lymphoproliferative disease.  EKG: Sinus rhythm   The patient will be admitted to Leukemia Service for further evaluation and management.   Objective: On exam,  weight is 73.7 kg (162 lb 6.4 oz). His oral temperature is 101.1 F (38.4 C) (abnormal). His blood pressure is 139/80 and his pulse is 89. His respiration is 18 and oxygen saturation is 98%.  Physical Exam: 02/03/24 Physical Exam Constitutional:      Appearance: He is not ill-appearing.  HENT:     Ears:     Comments: Left postauricular lymphadenopathy Cardiovascular:     Rate and Rhythm: Normal rate and regular rhythm.     Heart sounds: Murmur heard.  Pulmonary:     Effort: No respiratory distress.     Breath sounds: Normal breath sounds.  Abdominal:     Tenderness: There is abdominal tenderness. There is no guarding.     Comments: Multifocal lymphadenopathy  Musculoskeletal:     Right lower leg: No edema.     Left lower leg: No edema.  Skin:    General: Skin is warm.  Neurological:     Mental Status: He is alert and oriented to person, place, and time.      Assessment & Plan: Dagon Budai is a 38 y.o. 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  Overall, low concern for leukostasis syndrome at this time. PLAN: - Treatment Plan: Pending bone marrow biopsy, initiate Hydrea 1g BID - TLS prophylaxis: Start allopurinol 300mg  daily - IVF hydration 100 ml/hr NS - Pain Regimen: not needed at this time - Daily Labs: CBC, CMP, Ca, Mg, PO4, Uric Acid, LDH - Pre-Treatment TTE and EKG - Obtain CT neck and chest abdomen pelvis with contrast - Follow-up BCR-ABL testing  - Plan for bone marrow biopsy 10/8   - Strict I/Os and daily weights - ASP Galactomannan and DIC Q Monday  #Anemia #Thrombocytopenia - Admission CBC: WBC 374, Hb 7.2, Plt 173 - No obvious signs or symptoms of bleeding noted on exam at time of admission. - Blood consent signed at admission and placed in chart PLAN:  - Transfusion protocol to maintain Hb >7.0 and Plt >10K  - Transfusions today: none - Type and Crossmatch - Obtain daily CBC with differential - Obtain weekly DIC panel   #Intermittent fever #Not neutropenic  - Patient with intermittent fevers, noted to be febrile to 101.6 at time of presentation to the unit.  Likely secondary to be symptoms secondary to underlying hematologic malignancy.  Patient not neutropenic so overall low concern for acute infectious process, will obtain RPP given reported URI symptoms.  Will trend fever curve and treat appropriately with Tylenol and complete further infectious workup as necessary. PLAN:  - Obtain RPP - Fever plan: Tylenol, BCx >24H (sputum and urine cultures as appropriate), CXR >72H    Rest of management plan per HO-1, Dr. Renea. Please see Dr. Blanchard note for the full H&P.    Worth Austin, DO, PharmD PGY-2 Internal Medicine

## 2024-02-03 NOTE — Progress Notes (Signed)
 Pt transferred to room 635 with Leita PA. Report called to [facility] RN on [facility]. Heather Earnie Rigg, RN

## 2024-02-03 NOTE — Telephone Encounter (Signed)
 I called Mr. Eugene Bailey regarding his blood work from yesterday. Dr. Federico (hematologist) reviewed his labs that show acute leukocytosis with WBC 409.3, Hgb 7.8, ANC 126.9, 8% blasts. He recommends ER evaluation, preferably at [facility] in [place].   I notified Devere Minerva PA-C (Leukemia service at [facility]) regarding patient's anticipated arrival at their emergency room.   Based on patient's final diagnosis, we can follow up for local treatment/management with [facility].

## 2024-02-03 NOTE — Progress Notes (Signed)
 Blood work from visit on 02/02/24 was reviewed. WBC is critically elevated. I spoke with Sari, the oncology nurse who manages referrals, to confirm that the urgent referral placed yesterday was reviewed. Sari, RN confirmed receipt, reported that she has reviewed patient's imaging and laboratory results, and has forwarded the case to the [facility] at [facility]. Miriam RN from the [facility] will be calling the patient sometime today to arrange an appointment for him to be seen within the next 24-48 hours for further work up and management. I called and spoke with the patient to inform him of these critical results, check on his current condition, and review the next steps. Patient was at work at the time of the call and denied any concerning complaints. All questions were answered and all concerns addressed to the best of my ability ensuring a smooth transition from the urgent care setting to oncology care.

## 2024-02-03 NOTE — Progress Notes (Addendum)
 Hematology and Oncology New Patient Visit  Patient: Eugene Bailey Date: 02/03/2024 MRN: [id number]  History of Present Illness: This is a pleasant 38 y.o. male with no past medical history who presents for evaluation. Lonni Dys did a virtual visit with his PCP office 02/02/24 for malaise, shortness of breath with minimal activity, testicular pain, worse on the left. They recommended urgent care evaluation. He saw urgent care 02/02/24 for the same complaints and also reported pain below his navel, body aches, early satiety, unintentional weight loss, lymph node-like swelling in his abdomen, left armpit, right thigh, beneath the left breast, left back. On exam he had abdominal distention and tenderness, scattered lymphadenopathy, irregular, lumpy texture to his left testicle. The largest node is located in the left postauricular region measuring 2 x 5 cm. He had a CXR which showed no lymphadenopathy or acute findings. Abdominal ultrasound showed massive splenomegaly 25 cm. He also had a testicular ultrasound which showed multiple hypoechoic ill-defined areas concerning for systemic involvement.   Current status:  History of Present Illness The patient is a male who presents for evaluation of elevated white blood cell count, low hemoglobin, and enlarged spleen.  He was informed at the urgent care yesterday that his hemoglobin levels are low, his white blood cell count is high, and his spleen is enlarged, suggesting a possible diagnosis of leukemia. He has been experiencing fatigue for approximately 3 months, accompanied by chills and leg aches, particularly after bowling. He also reports feeling cold even in normal temperatures. He has been trying to maintain a consistent sleep schedule but often wakes up drenched in sweat. He believes he may have had fevers intermittently but has not measured his temperature at home. He had one syncopal episode last month which he attributed to dehydration. He also  reports shortness of breath during a shower this morning, which resolved after about 2 minutes.  He has noticed lumps on his body, including one on his right abdomen that appeared about a month ago and causes pain when he lies down or stretches. He also recalls noticing a similar lump in his right groin area earlier this year. He has been experiencing pain under his belly button for the past 2 weeks, which radiates to his left shoulder and back. He also reports pain on the lower left side of his back when lying down, which subsides after about an hour. He also reports left testicular pain when lying down or walking excessively, and notes that his left testicle is more raised than the right. He has been feeling bloated for the past 2 weeks, with a sensation of trapped gas. He has been eating less due to a lack of appetite and energy and gets full quickly.  He experiences a dry cough, which has been causing throat pain over the past week. He had nasal drainage last week, which has since resolved. He took medication for a suspected illness, which made him feel better but dry. He has been urinating more frequently, especially at night, but reports no blood or dysuria. He experienced a headache on Sunday morning, which improved after sleeping and drinking water.  He does not feel depressed but admits to a lack of interest in activities he used to enjoy. He reports no leg swelling but mentions left knee pain.  SOCIAL HISTORY He occasionally uses marijuana. He rarely consumes alcohol, with the last instance being twice last month. He works at [employer] in [place]. He lives alone.  FAMILY HISTORY He reports  no family history of blood problems or any medical issues in his parents. One sister, healthy. All live in [place].   ROS: A Complete ROS was performed and negative if not mentioned above.  ECOG/Zubrod Performance Status: 1 - Strenous physical activity restricted; fully ambulatory and able  to carry out light work KPS (80-70%)  PMHx: Medical History[1]  ALLERGIES: Allergies[2]  MEDS: Current Rx ordered in [facility][3]  SOCIAL HISTORY:  reports that he has never smoked. He has never used smokeless tobacco. He reports current alcohol use. He reports current drug use. Drug: Marijuana.  FAMILY HISTORY: family history is not on file.  PHYSICAL EXAM: Review Flowsheet       12/29/2019  ONCBCN RECENT VITALS  Height 183 cm  Weight 84.369 kg  BSA (Calculated - sq m) 2.07 m2  BP 140/81 163/93*  Heart Rate 78 85  Respiratory Rate 16 16    Details       Multiple values from one day are sorted in reverse-chronological order        GENERAL: Well appearing African American male in no acute distress. Ambulatory without assistance.  HEENT: Oropharynx without lesions.  Mucous membranes pink, moist, and intact. Neck supple. No conjunctival pallor, sclera anicteric, pupils equal and reactive. LYMPH: Scattered small < 1 cm cervical lymphadenopathy, 1 cm left axilla.  Soft tissue nodules: 1 2 cm right lower abdomen,  6 x 3 cm left post auricular, scattered < 1 cm nodules across scalp, 2 cm left bicep, 2 cm left lower back.  CARDIOVASCULAR: Slightly tachycardic rate and regular rhythm without murmurs, rubs, gallops.  PULM: Clear to ausculation bilaterally.  ABDOMEN: Abdomen distended, massive splenomegaly with edge of spleen not clearly palpable.  EXTREMITIES: No edema noted. SKIN: Skin is warm, pink and dry. No rashes, bruises or petechiae observed.   NEURO: Nonfocal.  LABS: Results for orders placed or performed in visit on 02/03/24  Phosphorus   Collection Time: 02/03/24  3:55 PM  Result Value Ref Range   Phosphorus 4.0 2.5 - 5.0 mg/dL  Magnesium   Collection Time: 02/03/24  3:55 PM  Result Value Ref Range   Magnesium 1.8 (L) 1.9 - 2.7 mg/dL  Uric Acid   Collection Time: 02/03/24  3:55 PM  Result Value Ref Range   Uric Acid 7.1 4.4 - 7.6 mg/dL  Lactate  Dehydrogenase (LDH)   Collection Time: 02/03/24  3:55 PM  Result Value Ref Range   Lactate Dehydrogenase (LDH) 918 (H) 140 - 271 U/L  Comprehensive Metabolic Panel   Collection Time: 02/03/24  3:55 PM  Result Value Ref Range   Sodium 136 136 - 145 mmol/L   Potassium 3.5 3.4 - 4.5 mmol/L   Chloride 99 98 - 107 mmol/L   CO2 34 (H) 21 - 31 mmol/L   Anion Gap 3 (L) 6 - 14 mmol/L   Glucose, Random 98 70 - 99 mg/dL   Blood Urea Nitrogen (BUN) 10 7 - 25 mg/dL   Creatinine 8.66 (H) 9.29 - 1.30 mg/dL   eGFR 71 >40 fO/fpw/8.26f7   Albumin 4.1 3.5 - 5.7 g/dL   Total Protein 8.6 6.4 - 8.9 g/dL   Bilirubin, Total 0.5 0.3 - 1.0 mg/dL   Alkaline Phosphatase (ALP) 62 34 - 104 U/L   Aspartate Aminotransferase (AST) 23 13 - 39 U/L   Alanine Aminotransferase (ALT) 15 7 - 52 U/L   Calcium 9.3 8.6 - 10.3 mg/dL   BUN/Creatinine Ratio 7.5 (L) 10.0 - 20.0  CBC with Differential  Collection Time: 02/03/24  3:55 PM  Result Value Ref Range   WBC 374.70 (HH) 4.40 - 11.00 10*3/uL   RBC 2.69 (L) 4.50 - 5.90 10*6/uL   Hemoglobin 7.2 (L) 14.0 - 17.5 g/dL   Hematocrit 76.6 (L) 58.4 - 50.4 %   Mean Corpuscular Volume (MCV) 86.6 80.0 - 96.0 fL   Mean Corpuscular Hemoglobin (MCH) 26.9 (L) 27.5 - 33.2 pg   Mean Corpuscular Hemoglobin Conc (MCHC) 31.0 (L) 33.0 - 37.0 g/dL   Red Cell Distribution Width (RDW) 20.5 (H) 12.3 - 17.0 %   Platelet Count (PLT) 173 150 - 450 10*3/uL   Mean Platelet Volume (MPV) 6.7 (L) 6.8 - 10.2 fL   Neutrophils % 33 %   Lymphocytes % 1 %   Monocytes % 1 %   Eosinophils % 0 %   Basophils % 1 %   Blasts % 11 %   Bands % 21 %   Promyelocytes % 3 %   Metamyelocytes % 15 %   Myelocyte % 14 %   nRBC per 100 WBCs 2 (H) 0 - 0 /100   Neutrophil Absolute (Man Diff) 127.80 (H) 1.80 - 7.80 10*3/uL   Lymphocytes Absolute (Man Diff) 3.90 1.00 - 4.80 10*3/uL   Monocytes Absolute (Man Diff) 3.90 (H) 0.00 - 0.80 10*3/uL   Eosinophils Absolute (Man Diff) 0.00 0.00 - 0.50 10*3/uL   Basophils  Absolute (Man Diff) 3.90 (H) 0.00 - 0.20 10*3/uL   Blasts Absolute (Man Diff) 42.60 10*3/uL   Bands Absolute (Man Diff) 81.30 10*3/uL   Promyelocyte Absolute (Man Diff) 11.61 10*3/uL   Metamyelocytes Absolute (Man Diff) 58.10 10*3/uL   Myelocytes Absolute (Man Diff) 54.20 10*3/uL   RBC & PLT Morphology Reviewed    Dacrocytes/Tear Drop 1+    Poikilocytes 1+    Polychromasia 1+    Rouleaux Present      Radiology data:    EXAM:  CHEST - 2 VIEW 02/02/24  CLINICAL DATA:  Generalized lymphadenopathy with systemic symptoms  including fatigue, chills, and unintentional weight loss.   COMPARISON:  None Available.   FINDINGS:  The heart is normal in size. No evidence of intrathoracic  lymphadenopathy by radiograph. No focal airspace disease, pleural  effusion, or pneumothorax. No pulmonary edema. No osseous  abnormalities.   IMPRESSION:  No radiographic evidence of lymphadenopathy in the thorax. No acute  findings.     EXAM:  ABDOMEN ULTRASOUND COMPLETE 02/02/24  CLINICAL DATA:  Periumbilical abdominal pain and distention   COMPARISON:  None Available.   FINDINGS:  Gallbladder: No gallstones or wall thickening visualized. No  sonographic Murphy sign noted by sonographer.   Common bile duct: Diameter: 3.4 mm.   Liver: Hepatomegaly (limited size measurement due to patient's  pain). No focal lesion identified. Within normal limits in  parenchymal echogenicity. Portal vein is patent on color Doppler  imaging with normal direction of blood flow towards the liver.   IVC: Limited evaluation.   Pancreas: Limited evaluation due to overlying bowel gas and  hepatosplenomegaly.   Spleen: Severe splenomegaly measuring 25.4 cm in length. No focal  splenic lesion identified. No subcapsular hematoma. Small splenule  measuring 1.8 cm adjacent to the spleen.   Right Kidney: Length: 10 cm. Increased echogenicity. No mass or  hydronephrosis visualized.   Left Kidney: Length: 11.8  cm. Increased echogenicity. No mass or  hydronephrosis visualized.   Abdominal aorta: No aneurysm visualized.   Other findings: Trace ascites.   IMPRESSION:  Severe hepatosplenomegaly. Recommend further assessment  for  lymphoproliferative (lymphoma/leukemia) and infectious/inflammatory  disease. Correlate with clinical findings. PET-CT or other  cross-sectional imaging (MRI or CT) can be utilized for further  assessment.   Increased echogenicity of the kidneys which can be seen the setting  of parenchymal medical disease. Correlate with renal function  tests.   These results will be called to the ordering clinician or  representative by the Radiologist Assistant, and communication  documented in the PACS or [employer].       EXAM: SCROTAL ULTRASOUND 02/02/24  DOPPLER ULTRASOUND OF THE TESTICLES   CLINICAL DATA:  Testicular pain.    TECHNIQUE:  Complete ultrasound examination of the testicles, epididymis, and  other scrotal structures was performed. Color and spectral Doppler  ultrasound were also utilized to evaluate blood flow to the  testicles.   COMPARISON: None Available.   FINDINGS:  Right testicle   Measurements: 4.5 x 2.7 x 2.7 cm. Heterogeneous with multiple  ill-defined hypoechoic areas. Index lesion measures 2.3 x 1.2 x 1.2  cm. Mild striated pattern.   Left testicle   Measurements: 4.4 x 2.6 x 3.5 cm. Heterogeneous with multiple  ill-defined hypoechoic areas. Index lesion measures 2.8 x 1.3 x 2.1  cm. Mild striated pattern.   Right epididymis: Normal in size and appearance. Epididymal tail  cyst measuring 5 mm.   Left epididymis:  Normal in size and appearance.   Hydrocele:  Bilateral small hydrocele.   Varicocele:  Bilateral mild varicocele with Valsalva maneuver.   Pulsed Doppler interrogation of both testes demonstrates hyperemia  of bilateral testis with normal flow.   IMPRESSION:  Multiple hypoechoic ill-defined areas throughout  both testis highly  concerning for systemic disease including lymphoproliferative  disease (lymphoma/leukemia) until proven otherwise. Correlate with  clinical findings.   Mild varicocele.   Small bilateral hydroceles.   These results will be called to the ordering clinician or  representative by the Radiologist Assistant, and communication  documented in the PACS or [employer].    IMPRESSION and PLAN:  Leukocytosis  Pleasant patient with no past medical history who presented with leukocytosis, anemia, splenomegaly, lymphadenopathy, fatigue, night sweats, testicular pain, soft tissue masses. Evaluation as above. Labs from here show WBC 374 with left shift, Hgb 7.2, Plt 173. Unclear etiology of symptoms. We discussed that the differential for his presentation includes leukemia (both CML and more acute processes) and lymphoma with bone marrow involvement. His CBC with differential appears similar to what we see with CML but testicular, soft tissue and lymph node involvement would be unusual. We discussed need for further evaluation and management and safest course is admission to leukemia service to which he is agreeable.   Disposition: Pending admission   Discussed with Dr. Perri.  Signed by Leita Crumble, PA-C 02/03/2024 4:55 PM          [1] No past medical history on file. [2] Allergies Allergen Reactions  . Tomato (Solanum Lycopersicum) Anaphylaxis  [3] No current Epic-ordered outpatient medications on file.   No current Epic-ordered facility-administered medications on file.  *Some images could not be shown.

## 2024-02-04 ENCOUNTER — Ambulatory Visit: Admitting: Physician Assistant

## 2024-02-04 ENCOUNTER — Other Ambulatory Visit

## 2024-02-04 NOTE — Progress Notes (Signed)
 Case Management Screening  CSN: [id number] DOB: Aug 19, 1985 Service: Oncology Location: C635/A   Initial Screening Readmission Risk Score v2: 9.2 Risk Level: High - Patient meets high risk criteria for post hospital services. Assessment to be completed. Patient has new or exacerbation of functional deficits:: Yes    [name], RN

## 2024-02-04 NOTE — Procedures (Signed)
 Bone Marrow Bx +/- Asp  Date/Time: 02/04/2024 9:55 AM  Performed by: Almarie Devere Minerva, PA-C Authorized by: Almarie Devere Minerva, PA-C  Consent: Written consent obtained Risks and benefits: risks, benefits and alternatives were discussed Consent given by: patient Patient understanding: patient states understanding of the procedure being performed Patient consent: the patient's understanding of the procedure matches consent given Procedure consent: procedure consent matches procedure scheduled Relevant documents: relevant documents present and verified Site marked: the operative site was marked Required items: required blood products, implants, devices, and special equipment available Patient identity confirmed: verbally with patient and provided demographic data Time out: Immediately prior to procedure a time out was called to verify the correct patient, procedure, equipment, support staff and site/side marked as required. Preparation: Patient was prepped and draped in the usual sterile fashion. Local anesthesia used: yes Anesthesia: local infiltration  Anesthesia: Local anesthesia used: yes Local Anesthetic: lidocaine 2% without epinephrine Anesthetic total: 10 mL  Sedation: Patient sedated: no  Patient tolerance: patient tolerated the procedure well with no immediate complications Comments: Site and Preparation: Patient Position: Lateral decubitus Area of Procedure: Left posterior iliac crest  Reason for performing procedures: Hyperleukocytosis with peripheral diff s/o CML with increased blasts, anemia; hepatosplenomegaly. Multiple subcutaneous nodules and multiple hypoechoic areas in the bilateral testicles. Description of Procedure: A routine Time-Out was performed. The area was prepped and draped in sterile fashion. Handwashing was performed immediately prior to procedure. Following adequate hemostasis, pressure dressing was applied at end of procedure. Instrument(s)  used: [place]  needle, Jamshidi needle. Procedure(s) performed: Unilateral aspirate, Unilateral biopsy. Aspiration was difficult likely due to packed marrow. Specimen sent for: Pathology, (Flow cytometry not sent due to difficult aspiration; already being done on peripheral blood); cytogenetics. FISH for t(9;22) being done on peripheral blood. Purple top held for additional ancillary studies as needed.

## 2024-02-04 NOTE — Progress Notes (Signed)
 Leukemia Progress Note  Admit Date:02/03/2024  5:53 PM; LOS:1 Attending: Evalene Letters, MD   SUBJECTIVE  Interval History:  Wed 02/04/2024 Patient was admitted overnight.  This morning patient reports continued congestion and runny nose.  Denies cough, SOB, chest pain, nausea, vomiting, diarrhea, dysuria.  Reports continued diffuse abdominal pain radiating to left lower back and testicular pain.  OBJECTIVE Temp:  [98.3 F (36.8 C)-103 F (39.4 C)] 103 F (39.4 C) Heart Rate:  [80-95] 90 Resp:  [16-20] 18 BP: (131-148)/(76-92) 141/90  Intake/Output Summary (Last 24 hours) at 02/04/2024 1512 Last data filed at 02/04/2024 1400 Gross per 24 hour  Intake 1730.84 ml  Output 400 ml  Net 1330.84 ml    Physical Exam: Physical Exam Constitutional:      Appearance: Normal appearance.  Cardiovascular:     Rate and Rhythm: Normal rate and regular rhythm.  Pulmonary:     Effort: Pulmonary effort is normal.     Breath sounds: Normal breath sounds.  Abdominal:     General: There is distension.     Comments: Splenomegaly Diffuse tenderness to palpation, worse left upper and lower quadrant  Musculoskeletal:     Right lower leg: No edema.     Left lower leg: No edema.  Skin:    General: Skin is warm and dry.     Comments: Diffuse lymphadenopathy and cutaneous nodules firm, nonmobile, nontender  Neurological:     General: No focal deficit present.     Mental Status: He is oriented to person, place, and time.      Relevant Interval Labs  Lab Results  Component Value Date   K 3.1 (L) 02/04/2024   BUN 8 02/04/2024   CREATININE 1.23 02/04/2024   URICACID 7.7 (H) 02/04/2024   LDH 716 (H) 02/04/2024   MG 1.6 (L) 02/04/2024   PHOS 3.4 02/04/2024   CALCIUM 8.0 (L) 02/04/2024   CALCORR 8.6 02/04/2024   BILITOT 0.4 02/04/2024     Lab Results  Component Value Date   WBC 309.80 (HH) 02/04/2024   HGB 6.0 (LL) 02/04/2024   PLT 131 (L) 02/04/2024   NEUTROABS 3.1 12/29/2019      No results found.    ASSESSMENT & PLAN Mostafa Yuan is a 38 y.o. 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   ONCOLOGY # Myeloproliferative neoplasm - Patient presenting with B symptoms, leukocytosis to 374, and multifocal lymphadenopathy with testicular involvement with clinical picture overall most consistent with a new myeloproliferative neoplasm.  Preliminary flow cytometry results per pathology most consistent with CM  -Testicular ultrasound demonstrating multiple hypoechoic ill-defined areas concerning for systemic disease including lymphoproliferative disease -Physical exam with diffuse lymphadenopathy and diffuse firm, nonmobile, nontender cutaneous nodules PLAN: - Treatment Plan: Pending bone marrow biopsy, Hydrea 1 g twice daily -PET CT -Urology consulted regarding consideration of testicular biopsy, appreciate recs -Dermatology consulted regarding consideration of skin biopsy, appreciate recs - TLS prophylaxis: Allopurinol 300 mg daily - IVF hydration: NS 100 mL/h - Pain regimen: Oxycodone 5 mg or 7.5 mg every 4 hour as needed moderate or severe pain --Daily CBC with Diff, CMP, Uric Acid, LDH --DIC panel and Aspergillus Galactomannan Ab every Monday   HEMATOLOGY # Anemia # Thrombocytopenia # Leukocytosis - Admission CBC: WBC 374, Hb 7.2, Plt 173  --Today:  Lab Results  Component Value Date   WBC 309.80 (HH) 02/04/2024   HGB 6.0 (LL) 02/04/2024   PLT 131 (L) 02/04/2024     PLAN: --Daily CBC with Diff --Transfusion protocol for Hb <7 and Platelets <10   --Transfusions today: 1u pRBCs  INFECTIOUS DISEASE # Febrile # Not neutropenic --WBC 374 and ANC 127 on admission -- Patient febrile on admission, only  infectious symptom reported was runny nose and congestion.  RVP obtained positive for rhinovirus.  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 -- PLAN: --Tx abx: Ceftriaxone (10/8-present) -- Follow-up blood and urine cultures, CXR --Fever plan: Tylenol, blood cultures if >24 hours, urine culture and CXR if >72 hours   FEN:     Dietary Orders  (From admission, onward)               Ordered    Diet NPO  Diet effective midnight       References:    Medical Nutrition Management (MNM) for Registered Dietitian    Medical Nutrition Management (MNM) by Registered Dietitian - [place]  Question Answer Comment  NPO except: Ice chips   NPO except: Sips with meds      02/04/24 1441    Adult Diet- Regular  Diet effective now       References:    Medical Nutrition Management (MNM) for Registered Dietitian  Question Answer Comment  Diet type: Regular   Medical Nutrition Management By RD Yes, Medical Nutrition Management By RD      02/04/24 1441    Ensure Plus; Twice daily with Breakfast/Lunch  Once       Question Answer Comment  WF [facility]: Ensure Plus   Frequency: Twice daily with Breakfast/Lunch      02/04/24 1324           DVT Proph: Not indicated, ambulatory Ethics:  Problem List Items Addressed This Visit     Splenomegaly   Relevant Medications   sodium chloride  0.9 % infusion     Electronically signed by: Edsel Macleod, MD 02/04/2024 3:12 PM    38 y.o. 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    Electronically Signed by: Evalene GORMAN Letters, MD, PhD, Attending Physician 02/04/2024 5:53 PM

## 2024-02-04 NOTE — Consults (Signed)
 Nutrition Note   PATIENT NAME: Eugene Bailey DATE: 02/04/2024  TIME: 1:23 PM  Note Type: Assessment Referral Reason: MST 3  Assessment  Per hem.onc plan of care note, 38 y.o. male with no significant past medical history who presents as a direct admission from the hematology/oncology clinic where he was seen earlier today for evaluation for new lymphoproliferative disease with recommended inpatient admission for further evaluation and management.  Patient initially did a visit with his PCP on 02/02/2024 for malaise, shortness of breath, and testicular pain for which she was evaluated in urgent care and underwent chest x-ray, abdominal ultrasound, and testicular ultrasound with findings overall concerning for new lymphoproliferative disease.  Upon arrival to the unit, he reports that he has had multifocal lymphadenopathy, fatigue, night sweats, and a 20 pound unintentional weight loss over the past 3 to 4 months... He endorses left lower quadrant abdominal pain and back pain as well as decreased appetite/fatigue.  He notes fever and chills at home, occurring every 2 to 3 days... He endorses sore throat with runny nose and started 1 week ago.  He endorses occasional marijuana use but denies alcohol, tobacco or any additional illicit drug use. Concern for CML.   Rhinovirus+. Pt endorses wt loss and decreasing intake for the last few months. Previous UBW of 187#. Only complaint at time of visit is left flank pain and back pain. Reports eggs and sausage this AM; now NPO for planned PET. Agreeable to ONS as below (vanilla pref; lactose sensitive).   Interventions  Nutrition Intervention: Medical food supplements, Collaboration and referral of nutrition care Monitor for diet advancement back to regular following procedure. Will add Ensure Plus BID    Nutrition Diagnosis  Severe protein-calorie malnutrition POA  Malnutrition Etiology: Chronic illness      Weight Loss: >10% weight loss - 6 months  Energy Intake: < or equal to 75% of est energy needs for > or equal to 1 month Muscle Mass Depletion Summary: mild muscle wasting           Nutrition Focused Physical Findings  NFPE Performed Date: 02/04/24 Signs of Muscle Wasting, Fat Loss or Micronutrient Deficiencies: Yes  Muscle Mass Depletion: Pectoralis: Mild Deltoids: Mild Gastrocnemius: Mild   Body Fat Depletion: Triceps Region: Mild            Pertinent Information  Anthropometrics Height: 182.9 cm (6') Weight: 73.7 kg (162 lb 6.4 oz)   Wt Readings from Last 3 Encounters:  02/03/24 73.7 kg (162 lb 6.4 oz)  02/03/24 74.3 kg (163 lb 11.2 oz)   No recent wt hx in chart review. Reported 13% wt loss in <6 months.  Weight Classification: Underweight BMI: 22.03 Skin Integrity: no PIs Nutrition Related Medications: mag sulfate, KCl, NS @100mL /hr Labs: Mg 1.6, K 3.1   Last BM Date: 02/03/24    Current Diet and Nutrition Support Details  Diet Regular- 75% per nursing documentation     Estimated Needs  Nutrition Needs Based On: Actual weight 73.7 kg (162 lb 7.7 oz)  Calories (kcal/day): 7782-7442 kcal/d  Calories based on: MSJ x 1.3-1.5  Protein (g/day): 88-101 g/d  Protein based on: 1.2-1.5g/kg, ABW  Fluid (mL/day):   1-1.1 mL/kcal     Monitoring & Evaluation  Nutrition Goals: Transition to oral diet (prevent further wt loss) Nutrition Goal Status: Initial   Follow-up Information  Follow-Up Date: 02/09/24 Follow-Up Reason: Reassessment   Contact RD on treatment team. After hours or weekends, use secure chat groupBETHA [email or web address]  [facility]  [facility] [place]  [facility] Dietitians [facility]  [facility] Dietitians [facility]  [facility] Dietitians [facility]  [facility] Dietitians   Eugene Bailey, [place] 02/04/2024 1:23 PM

## 2024-02-04 NOTE — Group Note (Signed)
 Inpatient Care Coordination Team Conference Note  02/04/2024   Time:8:56 AM   CSN: [id number]  DOB: [birth year]   Room/Bed: C635/A LOS: 1 Payor Info: Payor: Advertising copywriter / Plan: [employer] PPO EPO CHOICE SELECT / Product Type: PPO /    Admitting Diagnosis: Leukocytosis [D72.829]  Admit Date/Time: 02/03/2024  5:53 PM Admission Comments: No comment available   Primary Diagnosis: Leukocytosis Principal Problem: Leukocytosis  Predictive Model Details        9.2% (Low)  Factor Value   Calculated 02/04/2024 08:06 10% Latest hemoglobin in last 72 hrs 7.2 g/dL   Readmission Risk Score v2 Model 9% Number of ED visits in last 90 days 0    8% Number of active outpatient medication orders 1    7% Latest RDW in last 72 hrs 20.5 %    7% Braden score 22     Team Members Present: Case Manager, Nurse, Provider  Expected Discharge Date: Feb 08, 2024  [name], RN

## 2024-02-04 NOTE — Care Plan (Signed)
  Problem: Pain Goal: Improvement in pain assessment Outcome: Not Progressing

## 2024-02-04 NOTE — Progress Notes (Signed)
 Case Management Adult Assessment  CSN: [id number] DOB: [birth year] Service: Oncology Location: C635/A   Eugene Bailey is a 38 y.o. male with no significant past medical history presenting as direct admit from clinic for concern for new myeloproliferative neoplasm.    Info & Contacts Assessment Completed: In-person interview with Patient The patient's status at this time is:: Able to communicate Prior to admission, patient resided at: Private residence The patient's decision maker is:: Patient At discharge, will patient return to prior residence: Yes Barriers to education: No barriers   Extended Emergency Contact Information Primary Emergency Contact: Poarch,Cynthia Mobile Phone: [phone] Relation: Mother  Assessment Is this patient at baseline?: No Was patient independent with ADLs prior to admission?: Yes Was patient independent with mobility prior to admission?: Yes Does this patient have or need any DME?: No Does this patient have or need any home health services?: No Does this patient have or need any personal care services?: No       Discharge How will patient obtain prescription meds at discharge:: Commercial Insurance Type of Payer Source:: [employer] How will this patient obtain follow-up care after discharge?: Undetermined How will this patient reach the discharge destination?: Family/Friends Is this a Chronic Dialysis patient?: No Patient Discharge Goals of Care: Learning about Disease Management  Discharge Education Discharge Plan Discussed With:: Patient Education Readiness:: Acceptance Education Method:: Explanation Education Response:: Verbalizes Understanding   Patient health Questionnaire (PHQ) Will the patient answer the depression risk questions?: Yes   Over the past 2 weeks, how often have you been bothered by any of the following problems? Little interest or pleasure in doing things: Not at all Feeling down, depressed, or  hopeless: Not at all Patient Health Questionnaire-2 Score: 0   CM met patient at bedside to complete assessment and verify demographics. Pt lived alone in the [place], [place] area. Patient was Independent with ADLs/Mobility prior to admission (PTA). He does not have any DME at home. Patient did not have HH / PCS services PTA.  Family will provide transportation upon d/c. CM will continue to follow patient and family for continued support and progression of care. No additional need or concerns at this time.       Interpretation PHQ-2 Interpretation: Negative (None-minimal Depression Severity)       Case Management Coordination Status: Coordination In-Progress    Anticipated Discharge Location: To be determined  Eugene KATHEE Hong, RN

## 2024-02-04 NOTE — Consults (Signed)
-------------------------------------------------------------------------------   Attestation signed by Lauraine LITTIE Birmingham, MD at 02/04/2024  5:53 PM I discussed this patient with the consult resident physician, have reviewed the resident physician's note, edited as necessary, and agree with the document as written.   Sarah L. Birmingham, MD, MPH Associate Professor of Dermatology  -------------------------------------------------------------------------------  Dermatology Consult Note  Consulting Service: Leukemia Team  Reason for consult: noduels  History of Present Illness 38 y.o. 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 Denies fevers, chills or recent illnesses. Otherwise negative except as mentioned in HPI.   Physical Exam: Vitals:   02/04/24 1543  BP: 142/88  Pulse: 98  Resp: 18  Temp: (!) 102.7 F (39.3 C)  SpO2: 97%          Immobile firm flesh-colored nodules on scalp, trunk, arms   Labs: CBC:   Lab Results  Component Value Date   WBC 309.80 (HH) 02/04/2024   RBC 2.26 (L) 02/04/2024   HGB 6.0 (LL) 02/04/2024   HCT 19.9 (L) 02/04/2024   PLT 131 (L) 02/04/2024   DIFF:  Lab Results  Component Value Date   NEUTOPHILPCT 56 12/29/2019   MONOPCT 13 12/29/2019   BASOPCT 1 12/29/2019   EOSPCT 1 12/29/2019   NRBCPER100 2 (H) 02/04/2024   BANDABSM 55.80 02/04/2024   NEUTROABS 3.1 12/29/2019   LYMPHSABS 1.7 12/29/2019   MONOSABS 0.7 12/29/2019   BASOSABS 0.0 12/29/2019   EOSABS 0.0 12/29/2019   CMP:  Lab Results  Component Value Date   NA 138 02/04/2024   K 3.1 (L) 02/04/2024   CL 104 02/04/2024   CO2 31  02/04/2024   BUN 8 02/04/2024   GLUCOSE 91 02/04/2024   CREATININE 1.23 02/04/2024   CALCIUM 8.0 (L) 02/04/2024   PROT 6.8 02/04/2024   ALBUMIN 3.2 (L) 02/04/2024   BILITOT 0.4 02/04/2024   ALP 45 02/04/2024   AST 15 02/04/2024   ALT 11 02/04/2024   ANIONGAP 3 (L) 02/04/2024   PET scan pending BMBx pending  Assessment: 1. 65M. B symptoms. A/f workup up myeloproliferative neoplasm. Acute onset of nodules on the scalp, trunk, and extremities. DDX leukemia cutis vs other cutaneous manifestation of MPN vs infectious (fungal) vs other.   Plan: The above diagnosis was discussed with the patient as well as the primary team today. After evaluation, we decided that a skin biopsy for further analysis was indicated. Following verbal informed consent the patient underwent a 4 mm punch biopsy to a lesion on the left arm. The site was cleaned and anesthestized with 1% Lidocaine with epinephrine, closed with 1 4-0 chromic gut sutures, and a sterile dressing applied. We recommend standard wound care including cleansing site with gentle cleanser twice daily and applying Vaseline and a sterile bandage. The biopsy was sent as a RUSH and should be available in 1-2d for results. We will contact the primary team when the results become available to us . An additional sample was sent for tissue culture  Dr. Birmingham agrees with the plan.   Lauraine GEANNIE College, MD [facility] Dermatology PGY3

## 2024-02-04 NOTE — Progress Notes (Signed)
 I spoke to pt himself & he  verified allergies & that he is not on any prescribed medications  & does not take any OTC  products regularly either.  Ike JULIANNA Mania, CPhT  3:28 PM 02/04/2024 DPS enrolled for delivery. Please call [phone] with questions for patients bedded in [facility] and [phone] for all other locations.      Division of Pharmacy Services  Medication History Completion Note  Name/DOB/Age of Patient: Eugene Bailey / Aug 16, 1985 / 38 y.o.  Location: [facility]  Type: Hospital admission & Modality: Phone  Confirmed two patient identifiers: Yes  Confirmed patient is alert & oriented: Yes  Medication History Source (Med History Informants):  Patient   Asked about any missing medications (such as pumps, injectable meds, TPN, OTC, etc): Yes  PTA Med List:  Prior to Admission Medications     Reviewed by Ike JULIANNA Mania, CPhT on 02/04/24 at 1527    Medication Sig Last Dose Informant Taking? Status  oxyCODONE (ROXICODONE) 5 mg immediate release tablet Take 5 mg by mouth every 6 (six) hours as needed for moderate pain (4-6) or severe pain (7-10).    Active         Med Note (HUNT, HEIDI G   Wed Feb 04, 2024  3:27 PM) Has not picked this up, came to hospital first            Audit from Redirected Encounters   **Prior to Admission medications have not yet been reviewed for this encounter**     Selected Pharmacy:  [facility]  Comments: None  Medications reconciled by provider: Yes  Reconciled medications removed from PTA medlist:   Reconciled medications with dose/frequency adjustments:   New PTA medications added after reconciliation:    Signature/Co-signature, if required: Ike JULIANNA Mania, CPhT   Date/Time: 02/04/2024 3:28 PM                        [facility]  Division of Pharmacy Services  Pharmacy Monitoring - Medication Review on Admission  Demographics: Age: 38 y.o. Sex: male Weight: 73.7 kg (162 lb 6.4  oz)  Dialysis Status:  No Active Dialysis Orders  Morbid Obesity Status: Patient DOES NOT Meet Morbid Obesity Criteria  Current Active Antimicrobials: cefTRIAXone (ROCEPHIN) injection 1 g [[id number]]  Medications Eligible for Renal Adjustment:      Previous / Home Medications Ordered Inpatient Medications  Prior to Admission medications  Medication  oxyCODONE (ROXICODONE) 5 mg immediate release tablet    Inpatient Scheduled Medications  Medication Dose Route Frequency  . allopurinoL  300 mg oral Daily  . cefTRIAXone  1 g intravenous Q24H  . hydroxyUREA  1,000 mg oral BID      Inpatient IV Medications  Medication Dose Route Frequency  . sodium chloride   100 mL/hr intravenous Continuous      Inpatient PRN Medications  Medication Dose Route Frequency  . dextrose  12.5 g intravenous PRN  . dextrose  15 g oral PRN  . guaiFENesin  600 mg oral BID PRN  . oxyCODONE  5 mg oral Q4H PRN   Or  . oxyCODONE  7.5 mg oral Q4H PRN      Pertinent Labs:  Lab Results  Component Value Date   HGB 6.0 (LL) 02/04/2024   HGB 7.2 (L) 02/03/2024   HGB 16.3 12/29/2019    Lab Results  Component Value Date/Time   WBC 309.80 (HH) 02/04/2024 07:52 AM  PLT 131 (L) 02/04/2024 07:52 AM    Creatinine  Date Value Ref Range Status  02/04/2024 1.23 0.70 - 1.30 mg/dL Final  89/92/7974 8.66 (H) 0.70 - 1.30 mg/dL Final   HX CREATININE  Date Value Ref Range Status  12/29/2019 1.40 0.50 - 1.50 MG/DL Final   Blood Urea Nitrogen (BUN)  Date Value Ref Range Status  02/04/2024 8 7 - 25 mg/dL Final  89/92/7974 10 7 - 25 mg/dL Final   HX BUN  Date Value Ref Range Status  12/29/2019 14 8 - 24 MG/DL Final    Serum creatinine: 1.23 mg/dL 89/91/74 9248 Estimated creatinine clearance: 86.5 mL/min  Reconciliation Notes:    Heidi F Hunt

## 2024-02-06 NOTE — Telephone Encounter (Signed)
 Please run test script for Ponatinib  30 mg (#30) for 30 day-supply Provider: Dr. Crecencio ICD10: C92.10  Regimen details: This will be given as in combination with 7+3 (Cytarabined Daunorubicin)  Please let me know if any additional information is required.  Electronically signed by: Duwaine DELENA Burkes, PharmD 02/06/2024 3:46 PM

## 2024-02-10 NOTE — Progress Notes (Signed)
 Consult for services received and chart reviewed.  Recreation Therapist is in the process of evaluating patient for level of appropriateness for Recreation Therapy services and is in continuous consultation with the treatment team.  Full report of findings can be found in completed Recreation Therapy section of the active medical record

## 2024-02-10 NOTE — Telephone Encounter (Addendum)
 Medication Access Center Summary  Medication Iclusig 30mg     PA Status Denied  Approval Dates   Authorization # [id number]    Comments Denial letter scanned to chart. Denied due to lack of trial and failure to two or more plan preferred TKI therapies such as imatinb, Dasatinib, or Nilotinib. Provider may discuss this coverage decision with the physician or health care professional reviewer at [facility] by calling [phone].

## 2024-02-24 ENCOUNTER — Encounter: Payer: Self-pay | Admitting: Oncology
